# Patient Record
Sex: Female | Born: 1985 | Race: White | Hispanic: No | Marital: Married | Smoking: Never smoker
Health system: Southern US, Community
[De-identification: ages and names within clinical notes are randomized; demographics above are authoritative.]

## PROBLEM LIST (undated history)

## (undated) DIAGNOSIS — F419 Anxiety disorder, unspecified: Secondary | ICD-10-CM

## (undated) DIAGNOSIS — T7840XA Allergy, unspecified, initial encounter: Secondary | ICD-10-CM

## (undated) DIAGNOSIS — F32A Depression, unspecified: Secondary | ICD-10-CM

## (undated) HISTORY — DX: Depression, unspecified: F32.A

## (undated) HISTORY — DX: Allergy, unspecified, initial encounter: T78.40XA

## (undated) HISTORY — DX: Anxiety disorder, unspecified: F41.9

---

## 2006-07-30 HISTORY — PX: GALLBLADDER SURGERY: SHX652

## 2016-01-14 ENCOUNTER — Other Ambulatory Visit: Payer: Self-pay | Admitting: Orthopedic Surgery

## 2016-01-14 DIAGNOSIS — M25532 Pain in left wrist: Secondary | ICD-10-CM

## 2016-02-02 ENCOUNTER — Ambulatory Visit
Admission: RE | Admit: 2016-02-02 | Discharge: 2016-02-02 | Disposition: A | Discharge status: Home / Self Care | Payer: BC Managed Care – PPO | Source: Ambulatory Visit | Attending: Orthopedic Surgery | Admitting: Orthopedic Surgery

## 2016-02-02 ENCOUNTER — Other Ambulatory Visit: Payer: Self-pay

## 2016-02-02 DIAGNOSIS — M25532 Pain in left wrist: Secondary | ICD-10-CM

## 2016-02-02 MED ORDER — IOPAMIDOL (ISOVUE-M 200) INJECTION 41%
5.0000 mL | Freq: Once | INTRAMUSCULAR | Status: AC
  Administered 2016-02-02: 5 mL via INTRA_ARTICULAR

## 2016-07-05 ENCOUNTER — Other Ambulatory Visit: Payer: Self-pay | Admitting: Orthopaedic Surgery

## 2016-07-05 DIAGNOSIS — M4716 Other spondylosis with myelopathy, lumbar region: Secondary | ICD-10-CM

## 2016-07-10 ENCOUNTER — Ambulatory Visit
Admission: RE | Admit: 2016-07-10 | Discharge: 2016-07-10 | Disposition: A | Discharge status: Home / Self Care | Payer: BC Managed Care – PPO | Source: Ambulatory Visit | Attending: Orthopaedic Surgery | Admitting: Orthopaedic Surgery

## 2016-07-10 DIAGNOSIS — M4716 Other spondylosis with myelopathy, lumbar region: Secondary | ICD-10-CM

## 2019-10-04 ENCOUNTER — Ambulatory Visit: Payer: Self-pay | Attending: Internal Medicine

## 2019-10-04 DIAGNOSIS — Z23 Encounter for immunization: Secondary | ICD-10-CM | POA: Insufficient documentation

## 2019-10-04 NOTE — Progress Notes (Signed)
   Covid-19 Vaccination Clinic  Name:  Debra Reed    MRN: [id number] DOB: [birth year]  10/04/2019  [name] was observed post Covid-19 immunization for 15 minutes without incident. She was provided with Vaccine Information Sheet and instruction to access the V-Safe system.   [name] was instructed to call 911 with any severe reactions post vaccine: Marland Kitchen Difficulty breathing  . Swelling of face and throat  . A fast heartbeat  . A bad rash all over body  . Dizziness and weakness   Immunizations Administered    Name Date Dose VIS Date Route   Pfizer COVID-19 Vaccine 10/04/2019  1:55 PM 0.3 mL 07/10/2019 Intramuscular   Manufacturer: [employer], [employer]   Lot: [id number]   NDC: [address]-4

## 2019-10-25 ENCOUNTER — Ambulatory Visit: Payer: Self-pay | Attending: Internal Medicine

## 2019-10-25 DIAGNOSIS — Z23 Encounter for immunization: Secondary | ICD-10-CM

## 2019-10-25 NOTE — Progress Notes (Signed)
   Covid-19 Vaccination Clinic  Name:  Debra Reed    MRN: [id number] DOB: 1985/12/08  10/25/2019  [name] was observed post Covid-19 immunization for 15 minutes without incident. She was provided with Vaccine Information Sheet and instruction to access the V-Safe system.   [name] was instructed to call 911 with any severe reactions post vaccine: Marland Kitchen Difficulty breathing  . Swelling of face and throat  . A fast heartbeat  . A bad rash all over body  . Dizziness and weakness   Immunizations Administered    Name Date Dose VIS Date Route   Pfizer COVID-19 Vaccine 10/25/2019 12:31 PM 0.3 mL 07/10/2019 Intramuscular   Manufacturer: [employer], [employer]   Lot: [id number]   NDC: [address]-1

## 2019-12-31 ENCOUNTER — Encounter: Payer: Self-pay | Admitting: Nurse Practitioner

## 2020-01-25 ENCOUNTER — Ambulatory Visit: Payer: Self-pay | Admitting: Nurse Practitioner

## 2020-03-24 ENCOUNTER — Other Ambulatory Visit: Payer: Self-pay | Admitting: Gastroenterology

## 2020-03-24 DIAGNOSIS — R109 Unspecified abdominal pain: Secondary | ICD-10-CM

## 2020-03-24 DIAGNOSIS — R112 Nausea with vomiting, unspecified: Secondary | ICD-10-CM

## 2020-03-25 ENCOUNTER — Ambulatory Visit
Admission: RE | Admit: 2020-03-25 | Discharge: 2020-03-25 | Disposition: A | Discharge status: Home / Self Care | Payer: BC Managed Care – PPO | Source: Ambulatory Visit | Attending: Gastroenterology | Admitting: Gastroenterology

## 2020-03-25 DIAGNOSIS — R109 Unspecified abdominal pain: Secondary | ICD-10-CM

## 2020-03-25 DIAGNOSIS — R112 Nausea with vomiting, unspecified: Secondary | ICD-10-CM

## 2020-03-25 MED ORDER — IOPAMIDOL (ISOVUE-300) INJECTION 61%
100.0000 mL | Freq: Once | INTRAVENOUS | Status: AC | PRN
  Administered 2020-03-25: 100 mL via INTRAVENOUS

## 2020-08-31 ENCOUNTER — Telehealth: Payer: Self-pay

## 2020-08-31 NOTE — Telephone Encounter (Signed)
Pt was a pt at [facility] . She has set up a new pt the end of March.   Pt is requesting a refill on her dicyclomine 20 mg, which Alyssa previously prescribed.   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 I told pt I would relay message to Alyssa and try to get that message to her in her MyChart account.

## 2020-09-05 NOTE — Telephone Encounter (Signed)
See below

## 2020-09-05 NOTE — Telephone Encounter (Signed)
Called pt. Relayed Debra Reed is okay prescribing non controlled medications, so we can help with the Dicyclomine, she just needs to get her pharmacy to send a request. Resent pt an activation code for mychart. Instructed pt that once she gets mychart set up, to message Debra Reed what exactly she wants her note to say and that we would get that to her. Pt verbalized understanding

## 2020-09-05 NOTE — Telephone Encounter (Signed)
I am very familiar with patient and comfortable taking care of her non-controlled medication refills at this time if she can have the pharmacy send the request to [place]. Also, it looks like her MyChart account needs to be completed, and then I can complete that note for her. I just need to know details of what the note should say. Thanks!

## 2020-09-12 ENCOUNTER — Telehealth: Payer: Self-pay

## 2020-09-12 ENCOUNTER — Other Ambulatory Visit: Payer: Self-pay | Admitting: Physician Assistant

## 2020-09-12 NOTE — Telephone Encounter (Signed)
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 Please advise.

## 2020-09-12 NOTE — Telephone Encounter (Signed)
Letter sent to patient through her MyChart. Thanks!

## 2020-10-21 ENCOUNTER — Encounter: Payer: Self-pay | Admitting: Physician Assistant

## 2020-10-21 ENCOUNTER — Ambulatory Visit (INDEPENDENT_AMBULATORY_CARE_PROVIDER_SITE_OTHER): Payer: BC Managed Care – PPO | Admitting: Physician Assistant

## 2020-10-21 ENCOUNTER — Other Ambulatory Visit: Payer: Self-pay

## 2020-10-21 VITALS — BP 107/72 | HR 68 | Temp 97.3°F | Ht 68.0 in | Wt 191.0 lb

## 2020-10-21 DIAGNOSIS — Z8669 Personal history of other diseases of the nervous system and sense organs: Secondary | ICD-10-CM | POA: Diagnosis not present

## 2020-10-21 DIAGNOSIS — K580 Irritable bowel syndrome with diarrhea: Secondary | ICD-10-CM | POA: Diagnosis not present

## 2020-10-21 DIAGNOSIS — F419 Anxiety disorder, unspecified: Secondary | ICD-10-CM

## 2020-10-21 DIAGNOSIS — F5101 Primary insomnia: Secondary | ICD-10-CM

## 2020-10-21 DIAGNOSIS — F32A Depression, unspecified: Secondary | ICD-10-CM

## 2020-10-21 NOTE — Progress Notes (Signed)
New Patient Office Visit  Subjective:  Patient ID: Debra Reed, female    DOB: 31-Jul-1985  Age: 35 y.o. MRN: [id number]   HPI Debra Reed presents for new patient establishment. Patient was previously seen by myself at [facility].  She has started to see Lennox Laity, a counselor through [employer], for anxiety and depression, as well as Kizzie Fantasia, NP. She has started to have less "stomach issues" with her IBS-D since seeing them. Recently tried Effexor, but states this gave her the worst migraine she has ever had. She also has hx of insomnia and was doing well with Ambien, but this stopped working within the last few months. They tried her on Trazodone, which she says works intermittently.   Hx of migraines. Stable. Occasionally needs acute treatment, does not have medications with her today.   Past Medical History:  Diagnosis Date  . Allergy   . Anxiety   . Depression     Past Surgical History:  Procedure Laterality Date  . GALLBLADDER SURGERY  2008    Family History  Problem Relation Age of Onset  . Depression Mother   . Hyperlipidemia Mother   . Hypertension Mother     Social History   Socioeconomic History  . Marital status: Married    Spouse name: Not on file  . Number of children: Not on file  . Years of education: Not on file  . Highest education level: Not on file  Occupational History  . Not on file  Tobacco Use  . Smoking status: Never Smoker  . Smokeless tobacco: Never Used  Vaping Use  . Vaping Use: Never used  Substance and Sexual Activity  . Alcohol use: Yes    Alcohol/week: 1.0 standard drink    Types: 1 Glasses of wine per week  . Drug use: Not on file  . Sexual activity: Not on file  Other Topics Concern  . Not on file  Social History Narrative  . Not on file   Social Determinants of Health   Financial Resource Strain: Not on file  Food Insecurity: Not on file  Transportation Needs: Not on file  Physical  Activity: Not on file  Stress: Not on file  Social Connections: Not on file  Intimate Partner Violence: Not on file    ROS Review of Systems  Constitutional: Negative for activity change, appetite change, fever and unexpected weight change.  HENT: Negative for congestion.   Eyes: Negative for visual disturbance.  Respiratory: Negative for apnea, cough and shortness of breath.   Cardiovascular: Negative for chest pain, palpitations and leg swelling.  Gastrointestinal: Positive for diarrhea (intermittent). Negative for abdominal pain, blood in stool and constipation.  Endocrine: Negative for polydipsia, polyphagia and polyuria.  Genitourinary: Negative for dysuria and pelvic pain.  Musculoskeletal: Negative for arthralgias.  Skin: Negative for rash.  Neurological: Negative for dizziness, weakness and headaches.  Hematological: Negative for adenopathy. Does not bruise/bleed easily.  Psychiatric/Behavioral: Positive for sleep disturbance. Negative for suicidal ideas. The patient is nervous/anxious.     Objective:   Today's Vitals: BP 107/72   Pulse 68   Temp (!) 97.3 F (36.3 C)   Ht 5\' 8"  (1.727 m)   Wt 191 lb (86.6 kg)   LMP  (LMP Unknown) Comment: IUD  SpO2 99%   BMI 29.04 kg/m   Physical Exam Vitals and nursing note reviewed.  Constitutional:      Appearance: Normal appearance. She is normal weight. She is not toxic-appearing.  HENT:  Head: Normocephalic and atraumatic.     Right Ear: Tympanic membrane, ear canal and external ear normal.     Left Ear: Tympanic membrane, ear canal and external ear normal.     Nose: Nose normal.     Mouth/Throat:     Mouth: Mucous membranes are moist.  Eyes:     Extraocular Movements: Extraocular movements intact.     Conjunctiva/sclera: Conjunctivae normal.     Pupils: Pupils are equal, round, and reactive to light.  Cardiovascular:     Rate and Rhythm: Normal rate and regular rhythm.     Pulses: Normal pulses.     Heart  sounds: Normal heart sounds.  Pulmonary:     Effort: Pulmonary effort is normal.     Breath sounds: Normal breath sounds.  Abdominal:     General: Abdomen is flat. Bowel sounds are normal.     Palpations: Abdomen is soft.  Musculoskeletal:        General: Normal range of motion.     Cervical back: Normal range of motion and neck supple.  Skin:    General: Skin is warm and dry.  Neurological:     General: No focal deficit present.     Mental Status: She is alert and oriented to person, place, and time.  Psychiatric:        Mood and Affect: Mood normal.        Behavior: Behavior normal.        Thought Content: Thought content normal.        Judgment: Judgment normal.     Assessment & Plan:   Problem List Items Addressed This Visit   None   Visit Diagnoses    Anxiety and depression    -  Primary   Irritable bowel syndrome with diarrhea       Relevant Medications   dicyclomine (BENTYL) 20 MG tablet   Primary insomnia       History of migraine          Outpatient Encounter Medications as of 10/21/2020  Medication Sig  . dicyclomine (BENTYL) 20 MG tablet   . levonorgestrel (MIRENA, 52 MG,) 20 MCG/24HR IUD TO BE INSERTED ONE TIME BY PRESCRIBER. ROUTE INTRAUTERINE.   No facility-administered encounter medications on file as of 10/21/2020.    Follow-up: Return in about 1 year (around 10/21/2021) for annual CPE and labs .   New patient establishment in office today. Currently stable with medications. She had no acute concerns today, but will call for refills prn. She will continue f/up with [facility].  Total time spent with patient reviewing history, medications, and documentation was 33 minutes today.   Carra Brindley M Jaunice Mirza, PA-C

## 2020-11-07 ENCOUNTER — Other Ambulatory Visit: Payer: Self-pay

## 2020-11-07 ENCOUNTER — Telehealth: Payer: Self-pay

## 2020-11-07 MED ORDER — DICYCLOMINE HCL 20 MG PO TABS: 20.0000 mg | ORAL_TABLET | Freq: Three times a day (TID) | ORAL | 1 refills | Status: DC

## 2020-11-07 NOTE — Telephone Encounter (Signed)
.   LAST APPOINTMENT DATE: 10/21/2020   NEXT APPOINTMENT DATE:@Visit  date not found  MEDICATION:dicyclomine (BENTYL) 20 MG tablet   PHARMACY:[id number] - [place], [place] - [address]

## 2020-11-07 NOTE — Telephone Encounter (Signed)
Rx sent in

## 2021-02-08 ENCOUNTER — Other Ambulatory Visit: Payer: Self-pay

## 2021-02-08 ENCOUNTER — Telehealth: Payer: Self-pay

## 2021-02-08 MED ORDER — DICYCLOMINE HCL 20 MG PO TABS: 20.0000 mg | ORAL_TABLET | Freq: Three times a day (TID) | ORAL | 1 refills | Status: DC

## 2021-02-08 NOTE — Telephone Encounter (Signed)
  LAST APPOINTMENT DATE: 10/21/2020  NEXT APPOINTMENT DATE:@Visit  date not found  MEDICATION:dicyclomine (BENTYL) 20 MG tablet (Expired)  PHARMACY: [facility] [address] [place] - [address]    Please advise

## 2021-02-08 NOTE — Telephone Encounter (Signed)
Rx sent in

## 2021-02-21 IMAGING — CT CT ABD-PELV W/ CM
2 of 4 series · 13 of 46 positions shown, 15 images · IV contrast (iopamidol)
Comparison: Abdominal ultrasound 03/23/2020 report from CT abdomen
from 12/13/2007

CLINICAL DATA: Abdominal pain with nausea, vomiting, and diarrhea.
Symptoms for 8 months but worsening over the past week

EXAM:
CT ABDOMEN AND PELVIS WITH CONTRAST
TECHNIQUE: Multidetector CT imaging of the abdomen and pelvis was performed
using the standard protocol following bolus administration of
intravenous contrast.
CONTRAST:  100mL LGZCHM-I11 IOPAMIDOL (LGZCHM-I11) INJECTION 61%

[Series 3: abd pelvis 5.00 br40 s3 axial · axial · 0.69mm/px · z∈[+1401,+1781]mm · 10 of 92 slices shown, 12 images]
[im 8/92  soft-tissue]
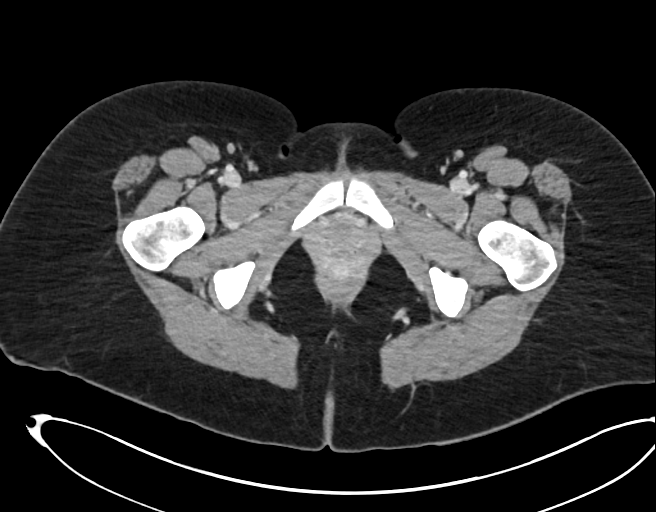
[im 8/92  bone]
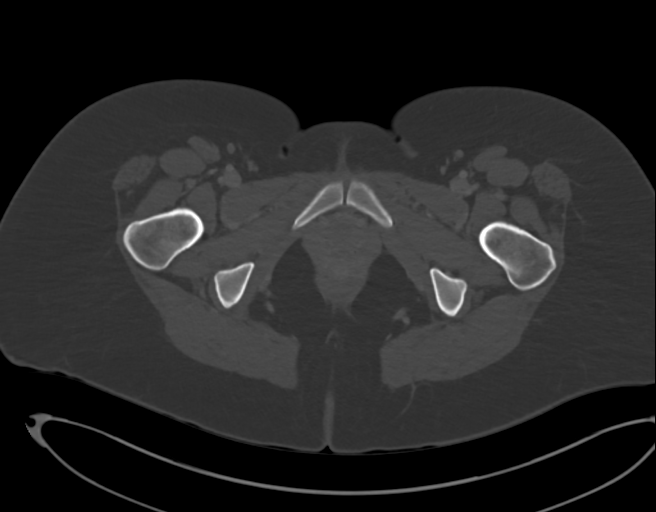
[im 16/92  soft-tissue]
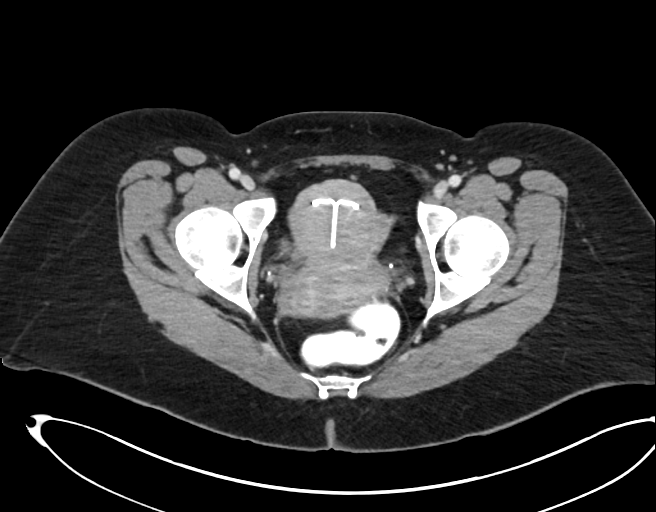
[im 24/92  soft-tissue]
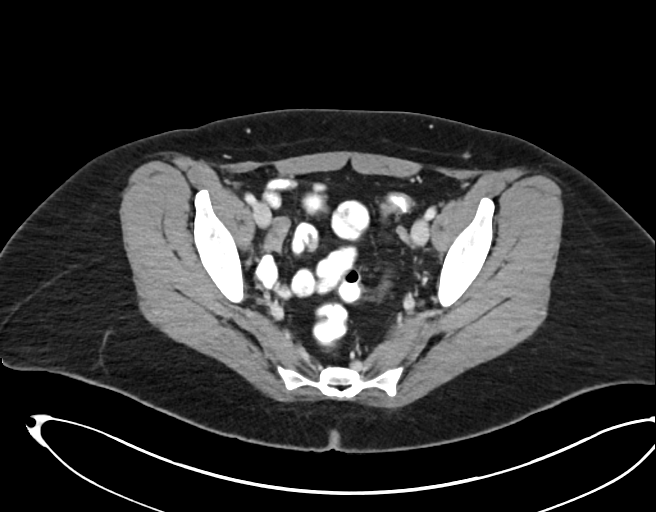
[im 32/92  soft-tissue]
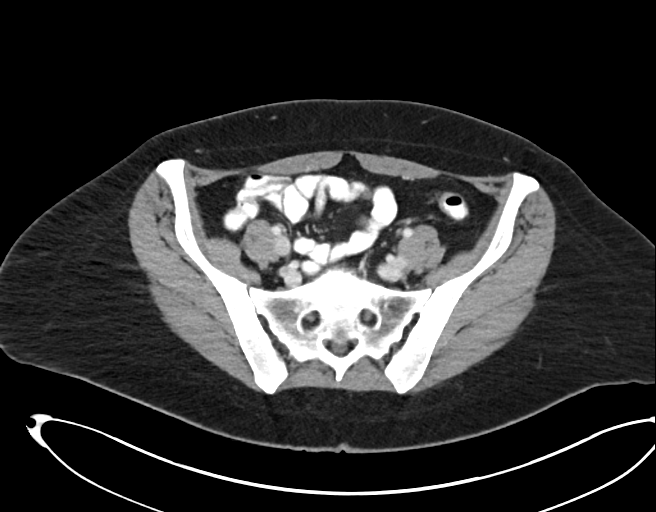
[im 40/92  soft-tissue]
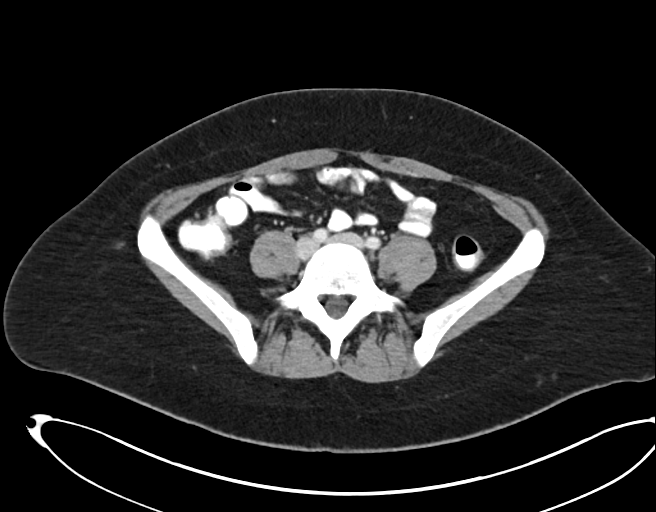
[im 52/92  soft-tissue]
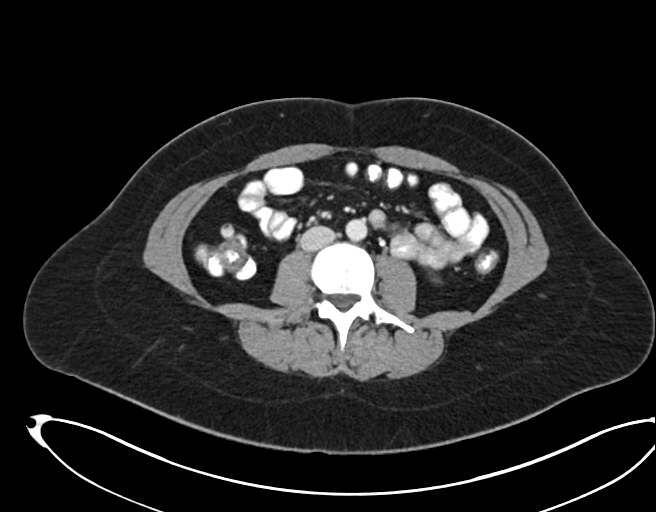
[im 60/92  soft-tissue]
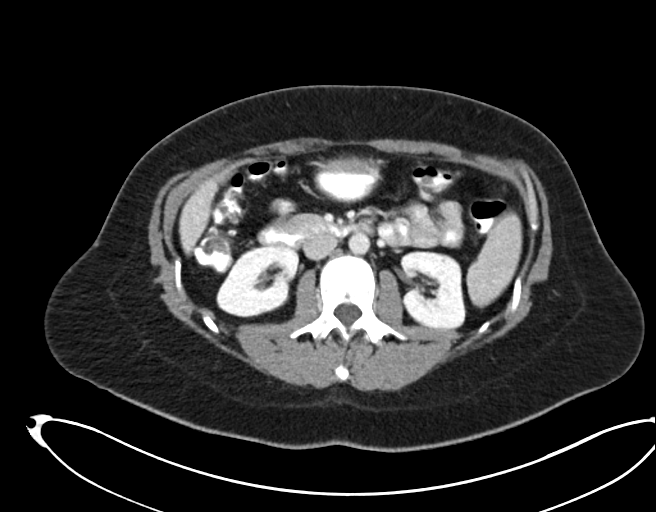
[im 68/92  soft-tissue]
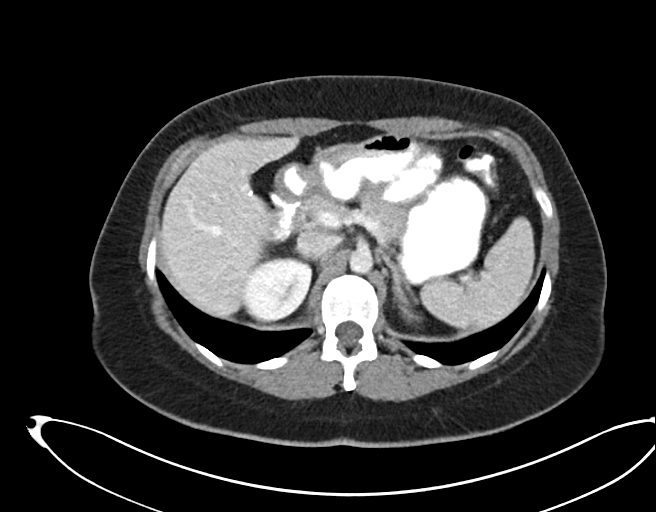
[im 76/92  soft-tissue]
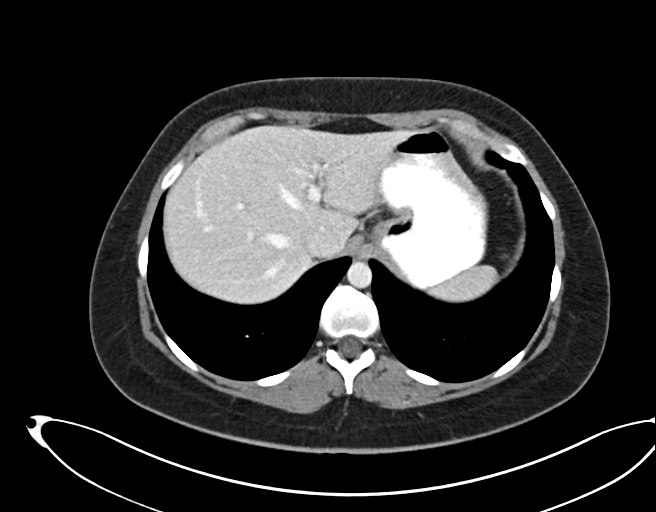
[im 76/92  bone]
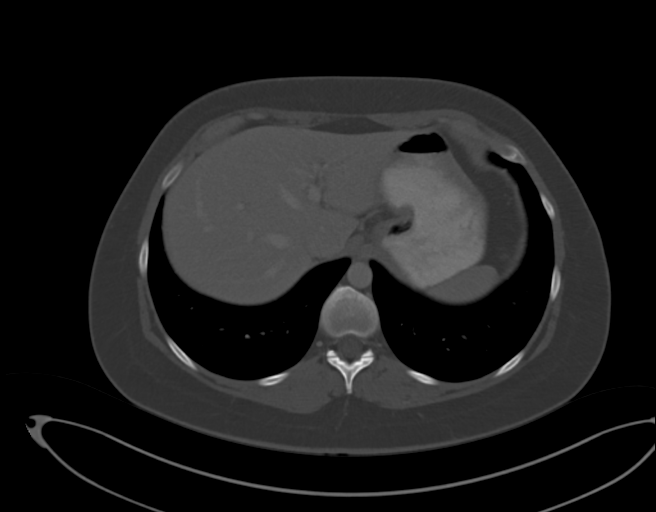
[im 84/92  soft-tissue]
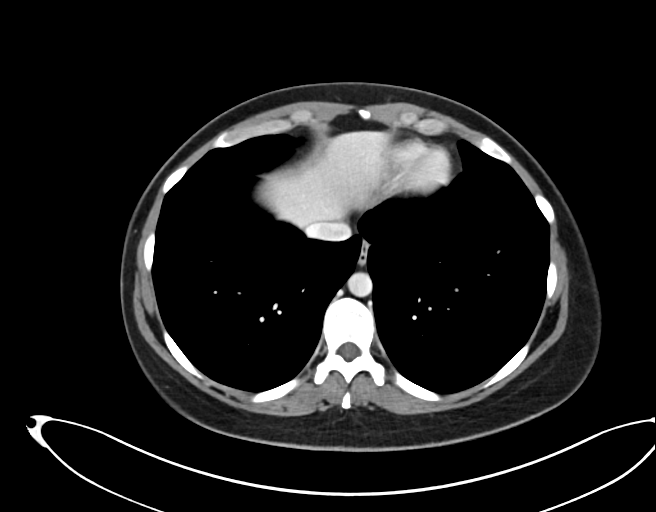

[Series 7: abd pelvis 2.00 br40 s3 cor · coronal · 0.88mm/px · 3 of 176 slices shown]
[im 59/176  soft-tissue]
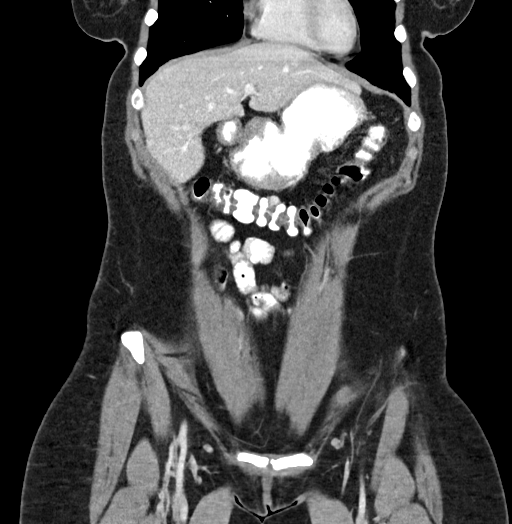
[im 78/176  soft-tissue]
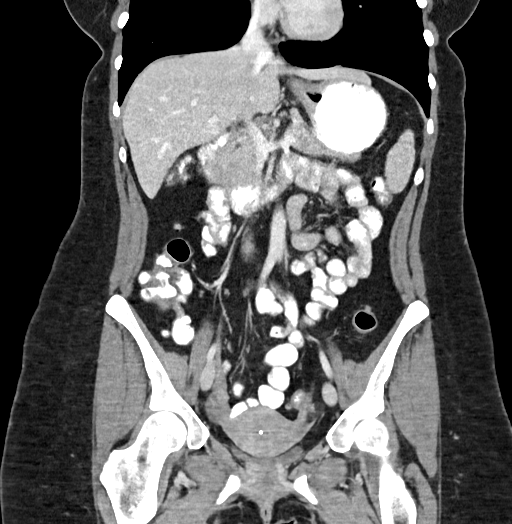
[im 98/176  soft-tissue]
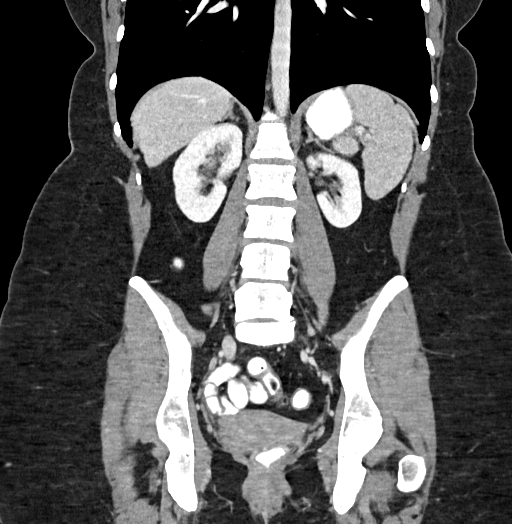

[13 of 46 positions shown; findings below may reference images not displayed]

FINDINGS: Lower chest: Unremarkable

Hepatobiliary: Hypodensity along the falciform ligament compatible
with mild focal fatty infiltration. 0.4 by 0.3 by 0.3 cm hypodensity
in segment 4 of the liver on image [DATE] may represent a tiny cyst or
small dilated segment of the biliary tree but is technically
nonspecific and likely benign/incidental.

Cholecystectomy noted. No significant degree of extrahepatic biliary
dilatation.

Pancreas: Unremarkable

Spleen: Unremarkable

Adrenals/Urinary Tract: Unremarkable

Stomach/Bowel: Scattered sigmoid colon diverticula without findings
of diverticulitis. Orally administered contrast extends through to
the rectum. No compelling findings of bowel wall thickening. Normal
appearance of the appendix and terminal ileum.

Vascular/Lymphatic: Unremarkable

Reproductive: A T-shaped IUD is satisfactorily positioned along the
endometrium. The ovaries appear normal.

Other: No supplemental non-categorized findings.

Musculoskeletal: Reduced intervertebral disc height at L5-S1
suggesting degenerative disc disease. No overt impingement.
IMPRESSION: 1. A cause for the patient's abdominal pain is not identified.
2. Scattered sigmoid colon diverticula without findings of
diverticulitis.
3. T-shaped IUD is satisfactorily positioned along the endometrium.
4. Reduced intervertebral disc height at L5-S1 suggesting
degenerative disc disease.

## 2021-05-16 ENCOUNTER — Telehealth: Payer: Self-pay

## 2021-05-16 NOTE — Telephone Encounter (Signed)
  Encourage patient to contact the pharmacy for refills or they can request refills through [facility]  LAST APPOINTMENT DATE:  10/21/2020  NEXT APPOINTMENT DATE: 09/2021  MEDICATION: dicyclomine (BENTYL) 20 MG tablet (Expired)  Is the patient out of medication? no  PHARMACY: [facility] [address] [place] - [address]  Let patient know to contact pharmacy at the end of the day to make sure medication is ready.  Please notify patient to allow 48-72 hours to process

## 2021-05-17 ENCOUNTER — Other Ambulatory Visit: Payer: Self-pay

## 2021-05-17 MED ORDER — DICYCLOMINE HCL 20 MG PO TABS: 20.0000 mg | ORAL_TABLET | Freq: Three times a day (TID) | ORAL | 1 refills | Status: DC

## 2021-05-17 NOTE — Telephone Encounter (Signed)
Rx sent in

## 2021-06-06 ENCOUNTER — Telehealth (INDEPENDENT_AMBULATORY_CARE_PROVIDER_SITE_OTHER): Payer: BC Managed Care – PPO | Admitting: Physician Assistant

## 2021-06-06 DIAGNOSIS — R051 Acute cough: Secondary | ICD-10-CM

## 2021-06-06 DIAGNOSIS — R6889 Other general symptoms and signs: Secondary | ICD-10-CM

## 2021-06-06 MED ORDER — BENZONATATE 100 MG PO CAPS: 100.0000 mg | ORAL_CAPSULE | Freq: Three times a day (TID) | ORAL | 0 refills | Status: DC | PRN

## 2021-06-06 MED ORDER — OSELTAMIVIR PHOSPHATE 75 MG PO CAPS: 75.0000 mg | ORAL_CAPSULE | Freq: Two times a day (BID) | ORAL | 0 refills | Status: DC

## 2021-06-06 NOTE — Progress Notes (Addendum)
Virtual Visit via Video Note  I connected with  Debra Reed  on 06/06/21 at 11:30 AM EST by a video enabled telemedicine application and verified that I am speaking with the correct person using two identifiers.  Location: Patient: home Provider: Nature conservation officer at [employer] Persons present: Patient and myself   I discussed the limitations of evaluation and management by telemedicine and the availability of in person appointments. The patient expressed understanding and agreed to proceed.   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   Assessment and Plan:  1. Flu-like symptoms 2. Acute cough -Influenza A is very predominant in the community right now, will treat presumptively for influenza.  She will take Tamiflu 75 mg 1 tablet twice per day for the next 5 days.  She knows to rest, push fluids, Tylenol and ibuprofen as needed.  Low threshold for emergency department should she acutely worsen. -Tessalon Perles for added relief to help suppress the cough. -She is also going to take a home COVID-19 test and let me know the results of this.  If she were to test positive for COVID-19, consider antiviral treatment for this as well. -Out of work note provided for this week.   Addendum: Pt called back to inform me her home COVID-19 test  was negative  Follow Up Instructions:    I discussed the assessment and treatment plan with the patient. The patient was provided an opportunity to ask questions and all were answered. The patient agreed with the plan and demonstrated an understanding of the instructions.   The patient was advised to call back or seek an in-person evaluation if the symptoms worsen or if the condition fails to improve as anticipated.  Camber Ninh M Jamerius Boeckman, PA-C

## 2021-08-23 ENCOUNTER — Other Ambulatory Visit: Payer: Self-pay | Admitting: Physician Assistant

## 2021-09-27 ENCOUNTER — Other Ambulatory Visit: Payer: Self-pay

## 2021-09-27 ENCOUNTER — Ambulatory Visit: Payer: BC Managed Care – PPO | Admitting: Physician Assistant

## 2021-09-27 ENCOUNTER — Encounter: Payer: Self-pay | Admitting: Physician Assistant

## 2021-09-27 VITALS — BP 98/58 | HR 85 | Temp 98.2°F | Wt 235.0 lb

## 2021-09-27 DIAGNOSIS — R319 Hematuria, unspecified: Secondary | ICD-10-CM

## 2021-09-27 DIAGNOSIS — R82998 Other abnormal findings in urine: Secondary | ICD-10-CM

## 2021-09-27 DIAGNOSIS — R300 Dysuria: Secondary | ICD-10-CM | POA: Diagnosis not present

## 2021-09-27 LAB — POCT URINALYSIS DIPSTICK
Bilirubin, UA: NEGATIVE
Blood, UA: POSITIVE
Glucose, UA: NEGATIVE
Ketones, UA: NEGATIVE
Nitrite, UA: NEGATIVE
Protein, UA: NEGATIVE
Spec Grav, UA: 1.01 (ref 1.010–1.025)
Urobilinogen, UA: 0.2 E.U./dL
pH, UA: 6.5 (ref 5.0–8.0)

## 2021-09-27 MED ORDER — NITROFURANTOIN MONOHYD MACRO 100 MG PO CAPS: 100.0000 mg | ORAL_CAPSULE | Freq: Two times a day (BID) | ORAL | 0 refills | Status: AC

## 2021-09-27 NOTE — Progress Notes (Signed)
? ?Subjective:  ? ? Patient ID: Debra Reed, female    DOB: November 26, 1985, 36 y.o.   MRN: [id number] ? ?Chief Complaint  ?Patient presents with  ? Urinary Tract Infection  ?  Burning and frequency started yesterday  ? ? ?HPI ?Patient is in today for urinary pressure, dysuria x 1 day. Thinks she has a UTI. It has been several years since last infection, but says this feels the same. She has been pushing water. No fever or chills. Some abd pressure. No flank pain. Some hematuria. No discharge. No other complaints.  ? ?Past Medical History:  ?Diagnosis Date  ? Allergy   ? Anxiety   ? Depression   ? ? ?Past Surgical History:  ?Procedure Laterality Date  ? GALLBLADDER SURGERY  2008  ? ? ?Family History  ?Problem Relation Age of Onset  ? Depression Mother   ? Hyperlipidemia Mother   ? Hypertension Mother   ? ? ?Social History  ? ?Tobacco Use  ? Smoking status: Never  ? Smokeless tobacco: Never  ?Vaping Use  ? Vaping Use: Never used  ?Substance Use Topics  ? Alcohol use: Yes  ?  Alcohol/week: 1.0 standard drink  ?  Types: 1 Glasses of wine per week  ?  ? ?No Known Allergies ? ?Review of Systems ?NEGATIVE UNLESS OTHERWISE INDICATED IN HPI ? ? ?   ?Objective:  ?  ? ?BP (!) 98/58   Pulse 85   Temp 98.2 ?F (36.8 ?C) (Temporal)   Wt 235 lb (106.6 kg)   SpO2 100%   BMI 35.73 kg/m?  ? ?Wt Readings from Last 3 Encounters:  ?09/27/21 235 lb (106.6 kg)  ?10/21/20 191 lb (86.6 kg)  ? ? ?BP Readings from Last 3 Encounters:  ?09/27/21 (!) 98/58  ?10/21/20 107/72  ?  ? ?Physical Exam ?Vitals and nursing note reviewed.  ?Constitutional:   ?   Appearance: Normal appearance. She is not toxic-appearing.  ?HENT:  ?   Head: Normocephalic and atraumatic.  ?   Nose: Nose normal.  ?   Mouth/Throat:  ?   Mouth: Mucous membranes are moist.  ?Eyes:  ?   Extraocular Movements: Extraocular movements intact.  ?   Conjunctiva/sclera: Conjunctivae normal.  ?   Pupils: Pupils are equal, round, and reactive to light.  ?Cardiovascular:  ?   Rate and  Rhythm: Normal rate and regular rhythm.  ?   Pulses: Normal pulses.  ?   Heart sounds: Normal heart sounds.  ?Pulmonary:  ?   Effort: Pulmonary effort is normal.  ?   Breath sounds: Normal breath sounds.  ?Abdominal:  ?   General: Abdomen is flat. Bowel sounds are normal.  ?   Palpations: Abdomen is soft.  ?   Tenderness: There is abdominal tenderness (low pressure pain). There is no right CVA tenderness or left CVA tenderness.  ?Musculoskeletal:     ?   General: Normal range of motion.  ?   Cervical back: Normal range of motion and neck supple.  ?Skin: ?   General: Skin is warm and dry.  ?Neurological:  ?   General: No focal deficit present.  ?   Mental Status: She is alert and oriented to person, place, and time.  ?Psychiatric:     ?   Mood and Affect: Mood normal.     ?   Behavior: Behavior normal.     ?   Thought Content: Thought content normal.     ?   Judgment: Judgment normal.  ? ? ?   ?  Assessment & Plan:  ? ?Problem List Items Addressed This Visit   ?None ?Visit Diagnoses   ? ? Dysuria    -  Primary  ? Relevant Orders  ? POCT Urinalysis Dipstick (Completed)  ? Urine Culture  ? Hematuria, unspecified type      ? Relevant Orders  ? Urine Culture  ? Leukocytes in urine      ? Relevant Orders  ? Urine Culture  ? ?  ? ? ? ?Meds ordered this encounter  ?Medications  ? nitrofurantoin, macrocrystal-monohydrate, (MACROBID) 100 MG capsule  ?  Sig: Take 1 capsule (100 mg total) by mouth 2 (two) times daily for 7 days.  ?  Dispense:  14 capsule  ?  Refill:  0  ? ?Plan: ?Dysuria - U/A performed in office today. Will send urine for culture and treat with macrobid at this time. Increase water intake. May take AZO for symptomatic relief. Recheck sooner if fever, severe back pain, vomiting, or other acutely worsening symptoms.  ? ? ?Zeba Luby M Welma Mccombs, PA-C ?

## 2021-09-29 LAB — URINE CULTURE
MICRO NUMBER:: 13073765
SPECIMEN QUALITY:: ADEQUATE

## 2021-10-03 ENCOUNTER — Telehealth: Payer: Self-pay | Admitting: Physician Assistant

## 2021-10-03 NOTE — Telephone Encounter (Signed)
Patient returning call from [facility] for lab results- please call [phone].  ?

## 2021-10-04 NOTE — Telephone Encounter (Signed)
Notified patient of lab results.Patient voices understanding.  

## 2021-10-23 ENCOUNTER — Encounter: Payer: BC Managed Care – PPO | Admitting: Physician Assistant

## 2021-11-06 ENCOUNTER — Encounter: Payer: BC Managed Care – PPO | Admitting: Physician Assistant

## 2022-01-03 ENCOUNTER — Telehealth: Payer: BC Managed Care – PPO | Admitting: Physician Assistant

## 2022-01-03 ENCOUNTER — Encounter: Payer: Self-pay | Admitting: Physician Assistant

## 2022-01-03 VITALS — Ht 68.0 in | Wt 235.0 lb

## 2022-01-03 DIAGNOSIS — R051 Acute cough: Secondary | ICD-10-CM | POA: Diagnosis not present

## 2022-01-03 DIAGNOSIS — J0100 Acute maxillary sinusitis, unspecified: Secondary | ICD-10-CM

## 2022-01-03 MED ORDER — AMOXICILLIN-POT CLAVULANATE 875-125 MG PO TABS: 1.0000 | ORAL_TABLET | Freq: Two times a day (BID) | ORAL | 0 refills | Status: AC

## 2022-01-03 MED ORDER — BENZONATATE 100 MG PO CAPS: 100.0000 mg | ORAL_CAPSULE | Freq: Three times a day (TID) | ORAL | 0 refills | Status: DC | PRN

## 2022-01-03 MED ORDER — PREDNISONE 20 MG PO TABS: 20.0000 mg | ORAL_TABLET | Freq: Two times a day (BID) | ORAL | 0 refills | Status: AC

## 2022-01-03 NOTE — Progress Notes (Signed)
   Virtual Visit via Video Note  I connected with  Debra Reed  on 01/03/22 at 11:30 AM EDT by a video enabled telemedicine application and verified that I am speaking with the correct person using two identifiers.  Location: Patient: Work (school) Provider: Therapist, music at [place] present: Patient and myself   I discussed the limitations of evaluation and management by telemedicine and the availability of in person appointments. The patient expressed understanding and agreed to proceed.   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   Assessment and Plan:  Acute maxillary sinusitis, recurrence not specified  Acute cough   Persistent symptoms despite conservative efforts at home. Will Rx Augmentin at this time, take with food. Cautioned on antibiotic use and possible side effects.  Tessalon Perles for added relief of cough.  Advised nasal saline, humidifier, and pushing fluids. Call if worse or no improvement.    Follow Up Instructions:    I discussed the assessment and treatment plan with the patient. The patient was provided an opportunity to ask questions and all were answered. The patient agreed with the plan and demonstrated an understanding of the instructions.   The patient was advised to call back or seek an in-person evaluation if the symptoms worsen or if the condition  fails to improve as anticipated.  Jet Traynham M Darwin Guastella, PA-C

## 2022-01-29 ENCOUNTER — Ambulatory Visit: Payer: BC Managed Care – PPO | Admitting: Physician Assistant

## 2022-02-23 ENCOUNTER — Telehealth (INDEPENDENT_AMBULATORY_CARE_PROVIDER_SITE_OTHER): Payer: BC Managed Care – PPO | Admitting: Physician Assistant

## 2022-02-23 ENCOUNTER — Encounter: Payer: Self-pay | Admitting: Physician Assistant

## 2022-02-23 VITALS — HR 74 | Temp 98.7°F | Ht 68.0 in | Wt 235.0 lb

## 2022-02-23 DIAGNOSIS — K580 Irritable bowel syndrome with diarrhea: Secondary | ICD-10-CM | POA: Diagnosis not present

## 2022-02-23 DIAGNOSIS — E669 Obesity, unspecified: Secondary | ICD-10-CM | POA: Diagnosis not present

## 2022-02-23 NOTE — Progress Notes (Signed)
   Virtual Visit via Video Note  I connected with  Debra Reed  on 02/23/22 at 11:30 AM EDT by a video enabled telemedicine application and verified that I am speaking with the correct person using two identifiers.  Location: Patient: home Provider: Nature conservation officer at [employer] Persons present: Patient and myself   I discussed the limitations of evaluation and management by telemedicine and the availability of in person appointments. The patient expressed understanding and agreed to proceed.   History of Present Illness:  36 yo female presents for virtual visit to discuss dicyclomine & weight.   Hasn't had any IBS-D symptoms in the last 6-12 months.  Has changed diet and going to therapy. Stress overall has greatly diminished. Taking Bentyl 20 mg once or twice per week for the last few months. No issues have come up. Medications with psychiatrist working very well.   Sister-in-law and mother have tried Ozempic with success; interested in medicine. Has tried phentermine in the past without results. 3 weeks in on Weight Watchers. Walking, hitting 10k steps daily. Has lost 6-7 lbs so far. 245 lb starting weight, goal is 145-150 lbs, expects to take 1 year or more to lose completely.    Observations/Objective:   Gen: Awake, alert, no acute distress Resp: Breathing is even and non-labored Psych: calm/pleasant demeanor Neuro: Alert and Oriented x 3, + facial symmetry, speech is clear.   Assessment and Plan:  1. Irritable bowel syndrome with diarrhea Discontinue bentyl at this time; seems that symptoms were anxiety and stress related, those are well-controlled now. Cont good work with diet changes. Call back if any symptoms resurface.  2. Obesity, Class II, BMI 35-39.9 Discussed options including Saxenda once daily injections and also once weekly GLP injections, which she will not be applicable for at this time as she's not diabetic. Encouraged her to cont with [employer]  program and push through slow / steady weight loss. As new medications become available, may be able to pursue other options then.    Follow Up Instructions:    I discussed the assessment and treatment plan with the patient. The patient was provided an opportunity to ask questions and all were answered. The patient agreed with the plan and demonstrated an understanding of the instructions.   The patient was advised to call back or seek an in-person evaluation if the symptoms worsen or if the condition fails to improve as anticipated.  Quantavious Eggert M Daziah Hesler, PA-C

## 2022-04-23 ENCOUNTER — Encounter: Payer: Self-pay | Admitting: *Deleted

## 2022-05-21 ENCOUNTER — Telehealth: Payer: Self-pay | Admitting: Physician Assistant

## 2022-05-21 NOTE — Telephone Encounter (Signed)
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 Please advise patient

## 2022-05-21 NOTE — Telephone Encounter (Signed)
Patient advised as requested 

## 2022-05-21 NOTE — Telephone Encounter (Signed)
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

## 2022-07-10 ENCOUNTER — Other Ambulatory Visit: Payer: Self-pay | Admitting: Physician Assistant

## 2022-07-10 MED ORDER — AMOXICILLIN 500 MG PO CAPS: 500.0000 mg | ORAL_CAPSULE | Freq: Two times a day (BID) | ORAL | 0 refills | Status: AC

## 2022-07-12 ENCOUNTER — Encounter: Payer: Self-pay | Admitting: *Deleted

## 2022-10-16 ENCOUNTER — Encounter: Payer: Self-pay | Admitting: Family Medicine

## 2022-10-16 ENCOUNTER — Ambulatory Visit: Payer: BC Managed Care – PPO | Admitting: Family Medicine

## 2022-10-16 VITALS — BP 117/74 | HR 63 | Temp 97.3°F | Ht 68.0 in | Wt 233.0 lb

## 2022-10-16 DIAGNOSIS — J0100 Acute maxillary sinusitis, unspecified: Secondary | ICD-10-CM

## 2022-10-16 MED ORDER — AMOXICILLIN-POT CLAVULANATE 875-125 MG PO TABS: 1.0000 | ORAL_TABLET | Freq: Two times a day (BID) | ORAL | 0 refills | Status: DC

## 2022-10-16 MED ORDER — BENZONATATE 100 MG PO CAPS: 100.0000 mg | ORAL_CAPSULE | Freq: Three times a day (TID) | ORAL | 0 refills | Status: DC | PRN

## 2022-10-16 MED ORDER — AZELASTINE HCL 0.1 % NA SOLN: 2.0000 | Freq: Two times a day (BID) | NASAL | 12 refills | Status: DC

## 2022-10-16 NOTE — Progress Notes (Signed)
   Debra Reed is a 37 y.o. female who presents today for an office visit.  Assessment/Plan:  Sinusitis  No red flags.  Given length of symptoms will start Augmentin.  Also start Astelin nasal spray.  Will refill Tessalon which has worked well for her in the past for her cough.  Encouraged hydration.  She can continue over-the-counter meds as needed.  We discussed reasons to return to care.  Follow-up as needed.    Subjective:  HPI:  Patient here with cough and congestion. This has been going on for about 3 weeks. Home covid test was negative. Tried taking OTC medications without much improvement. Son has been sick with similar symptoms. No fevers or chills. Some facial pain and pressure. She has a lot of mucus production. Thick and green in the morning.        Objective:  Physical Exam: BP 117/74   Pulse 63   Temp (!) 97.3 F (36.3 C) (Temporal)   Ht 5\' 8"  (1.727 m)   Wt 233 lb (105.7 kg)   SpO2 97%   BMI 35.43 kg/m   Gen: No acute distress, resting comfortably HEENT: TMs with clear effusion.  OP erythematous.  Nasal mucosa edematous bilaterally with thick discharge. CV: Regular rate and rhythm with no murmurs appreciated Pulm: Normal work of breathing, clear to auscultation bilaterally with no crackles, wheezes, or rhonchi Neuro: Grossly normal, moves all extremities Psych: Normal affect and thought content      Debra Reed M. Jerline Pain, MD 10/16/2022 7:45 AM

## 2022-10-16 NOTE — Patient Instructions (Signed)
It was very nice to see you today!  You have a sinus infection.   Please start the Augmentin and Astelin.  Use the Tessalon as needed.  Make sure that you are getting plenty of fluids.  Let us know if not improving.  Take care, Dr Jerline Pain  PLEASE NOTE:  If you had any lab tests, please let us know if you have not heard back within a few days. You may see your results on mychart before we have a chance to review them but we will give you a call once they are reviewed by [place].   If we ordered any referrals today, please let us know if you have not heard from their office within the next week.   If you had any urgent prescriptions sent in today, please check with the pharmacy within an hour of our visit to make sure the prescription was transmitted appropriately.   Please try these tips to maintain a healthy lifestyle:  Eat at least 3 REAL meals and 1-2 snacks per day.  Aim for no more than 5 hours between eating.  If you eat breakfast, please do so within one hour of getting up.   Each meal should contain half fruits/vegetables, one quarter protein, and one quarter carbs (no bigger than a computer mouse)  Cut down on sweet beverages. This includes juice, soda, and sweet tea.   Drink at least 1 glass of water with each meal and aim for at least 8 glasses per day  Exercise at least 150 minutes every week.

## 2022-11-20 ENCOUNTER — Encounter: Payer: Self-pay | Admitting: Physician Assistant

## 2022-11-20 ENCOUNTER — Telehealth (INDEPENDENT_AMBULATORY_CARE_PROVIDER_SITE_OTHER): Payer: BC Managed Care – PPO | Admitting: Physician Assistant

## 2022-11-20 DIAGNOSIS — J029 Acute pharyngitis, unspecified: Secondary | ICD-10-CM

## 2022-11-20 MED ORDER — AMOXICILLIN 500 MG PO CAPS: 1000.0000 mg | ORAL_CAPSULE | Freq: Two times a day (BID) | ORAL | 0 refills | Status: AC

## 2022-11-20 NOTE — Progress Notes (Signed)
   Virtual Visit via Video Note  I connected with  Debra Reed  on 11/20/22 at 11:45 AM EDT by a video enabled telemedicine application and verified that I am speaking with the correct person using two identifiers.  Location: Patient: home Provider: Nature conservation officer at [employer] Persons present: Patient and myself   I discussed the limitations of evaluation and management by telemedicine and the availability of in person appointments. The patient expressed understanding and agreed to proceed.   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   Assessment and Plan:  Acute pharyngitis, unspecified etiology Will treat for likely strep pharyngitis.  Patient is to start amoxicillin 1000 mg BID x 10 days, use supportive care otherwise. Counseled patient on potential for adverse effects with medications prescribed/recommended today, ER and return-to-clinic precautions discussed, patient verbalized understanding.   Follow Up Instructions:    I discussed the assessment and treatment plan with the patient. The patient was provided an opportunity to ask questions and all were answered. The patient agreed with the plan and demonstrated an understanding of the instructions.   The patient was advised to call back or seek an in-person evaluation if the symptoms worsen or if the condition fails to improve as anticipated.  Alexiya Franqui M Emmanuel Ercole, PA-C

## 2022-11-23 ENCOUNTER — Telehealth (INDEPENDENT_AMBULATORY_CARE_PROVIDER_SITE_OTHER): Payer: BC Managed Care – PPO | Admitting: Physician Assistant

## 2022-11-23 VITALS — Ht 68.0 in | Wt 233.0 lb

## 2022-11-23 DIAGNOSIS — Z713 Dietary counseling and surveillance: Secondary | ICD-10-CM | POA: Diagnosis not present

## 2022-11-23 NOTE — Progress Notes (Signed)
   Virtual Visit via Video Note  I connected with  Debra Reed  on 11/23/22 at  8:30 AM EDT by a video enabled telemedicine application and verified that I am speaking with the correct person using two identifiers.  Location: Patient: home Provider: Nature conservation officer at [employer] Persons present: Patient and myself   I discussed the limitations of evaluation and management by telemedicine and the availability of in person appointments. The patient expressed understanding and agreed to proceed.   History of Present Illness:  37 yo female presents for VV to discuss weight concerns. Current wt: 225 lb per scale at home She is actively working on change. She started exercising 3/week last week.  Weight Watchers x 6 months, feels like food noise is better controlled. Would like to discuss medication options to help with wt loss.   Observations/Objective:   Gen: Awake, alert, no acute distress Resp: Breathing is even and non-labored Psych: calm/pleasant demeanor Neuro: Alert and Oriented x 3, + facial symmetry, speech is clear.   Assessment and Plan:  1. Encounter for weight loss counseling Pt in active state of change. Goal wt per pt is 165 lb in the next few years, wants to do things 'right and slow.' Talked about oral medication options such as Wellbutrin, phentermine, orlistat; none highly recommended due to side effects. She would benefit from GLP1 agonists, but state health plan not currently helping with coverage and she is not able to afford very high monthly cost. Will continue efforts at home. Will check labs this summer and monitor progress.    Follow Up Instructions:    I discussed the assessment and treatment plan with the patient. The patient was provided an opportunity to ask questions and all were answered. The patient agreed with the plan and demonstrated an understanding of the instructions.   The patient was advised to call back or seek an  in-person evaluation if the symptoms worsen or if the condition fails to improve as anticipated.  Cintia Gleed M Tadeo Besecker, PA-C

## 2023-02-22 ENCOUNTER — Ambulatory Visit: Payer: BC Managed Care – PPO | Admitting: Physician Assistant

## 2023-02-22 ENCOUNTER — Encounter: Payer: Self-pay | Admitting: Physician Assistant

## 2023-02-22 VITALS — BP 118/80 | HR 52 | Temp 97.6°F | Ht 68.0 in | Wt 237.4 lb

## 2023-02-22 DIAGNOSIS — F419 Anxiety disorder, unspecified: Secondary | ICD-10-CM

## 2023-02-22 DIAGNOSIS — Z6836 Body mass index (BMI) 36.0-36.9, adult: Secondary | ICD-10-CM | POA: Diagnosis not present

## 2023-02-22 DIAGNOSIS — E782 Mixed hyperlipidemia: Secondary | ICD-10-CM

## 2023-02-22 DIAGNOSIS — F32A Depression, unspecified: Secondary | ICD-10-CM

## 2023-02-22 DIAGNOSIS — E559 Vitamin D deficiency, unspecified: Secondary | ICD-10-CM | POA: Diagnosis not present

## 2023-02-22 DIAGNOSIS — E669 Obesity, unspecified: Secondary | ICD-10-CM | POA: Insufficient documentation

## 2023-02-22 DIAGNOSIS — R5383 Other fatigue: Secondary | ICD-10-CM

## 2023-02-22 LAB — LIPID PANEL
Cholesterol: 127 mg/dL (ref 0–200)
HDL: 50.8 mg/dL (ref >39.00)
LDL Cholesterol: 65 mg/dL (ref 0–99)
NonHDL: 76.44
Total CHOL/HDL Ratio: 3
Triglycerides: 56 mg/dL (ref 0.0–149.0)
VLDL: 11.2 mg/dL (ref 0.0–40.0)

## 2023-02-22 LAB — IBC + FERRITIN
Ferritin: 51.1 ng/mL (ref 10.0–291.0)
Iron: 103 ug/dL (ref 42–145)
Saturation Ratios: 32.1 % (ref 20.0–50.0)
TIBC: 320.6 ug/dL (ref 250.0–450.0)
Transferrin: 229 mg/dL (ref 212.0–360.0)

## 2023-02-22 LAB — CBC WITH DIFFERENTIAL/PLATELET
Basophils Absolute: 0.1 10*3/uL (ref 0.0–0.1)
Basophils Relative: 0.8 % (ref 0.0–3.0)
Eosinophils Absolute: 0.1 10*3/uL (ref 0.0–0.7)
Eosinophils Relative: 0.9 % (ref 0.0–5.0)
HCT: 43.7 % (ref 36.0–46.0)
Hemoglobin: 14.7 g/dL (ref 12.0–15.0)
Lymphocytes Relative: 31 % (ref 12.0–46.0)
Lymphs Abs: 2.2 10*3/uL (ref 0.7–4.0)
MCHC: 33.6 g/dL (ref 30.0–36.0)
MCV: 94.4 fl (ref 78.0–100.0)
Monocytes Absolute: 0.5 10*3/uL (ref 0.1–1.0)
Monocytes Relative: 6.7 % (ref 3.0–12.0)
Neutro Abs: 4.3 10*3/uL (ref 1.4–7.7)
Neutrophils Relative %: 60.6 % (ref 43.0–77.0)
Platelets: 232 10*3/uL (ref 150.0–400.0)
RBC: 4.63 Mil/uL (ref 3.87–5.11)
RDW: 12.8 % (ref 11.5–15.5)
WBC: 7.2 10*3/uL (ref 4.0–10.5)

## 2023-02-22 LAB — COMPREHENSIVE METABOLIC PANEL
ALT: 10 U/L (ref 0–35)
AST: 15 U/L (ref 0–37)
Albumin: 4.3 g/dL (ref 3.5–5.2)
Alkaline Phosphatase: 62 U/L (ref 39–117)
BUN: 14 mg/dL (ref 6–23)
CO2: 25 mEq/L (ref 19–32)
Calcium: 9.3 mg/dL (ref 8.4–10.5)
Chloride: 104 mEq/L (ref 96–112)
Creatinine, Ser: 0.88 mg/dL (ref 0.40–1.20)
GFR: 83.68 mL/min (ref >60.00)
Glucose, Bld: 89 mg/dL (ref 70–99)
Potassium: 4.4 mEq/L (ref 3.5–5.1)
Sodium: 137 mEq/L (ref 135–145)
Total Bilirubin: 0.7 mg/dL (ref 0.2–1.2)
Total Protein: 7.1 g/dL (ref 6.0–8.3)

## 2023-02-22 LAB — T4, FREE: Free T4: 0.83 ng/dL (ref 0.60–1.60)

## 2023-02-22 LAB — HEMOGLOBIN A1C: Hgb A1c MFr Bld: 5 % (ref 4.6–6.5)

## 2023-02-22 LAB — VITAMIN B12: Vitamin B-12: 195 pg/mL — ABNORMAL LOW (ref 211–911)

## 2023-02-22 LAB — VITAMIN D 25 HYDROXY (VIT D DEFICIENCY, FRACTURES): VITD: 30.94 ng/mL (ref 30.00–100.00)

## 2023-02-22 LAB — TSH: TSH: 2.76 u[IU]/mL (ref 0.35–5.50)

## 2023-02-22 NOTE — Assessment & Plan Note (Signed)
Stable on Abilify and Buspar Follows with psych Counseling once weekly

## 2023-02-22 NOTE — Progress Notes (Signed)
Subjective:    Patient ID: Debra Reed, female    DOB: February 20, 1986, 37 y.o.   MRN: [id number]  Chief Complaint  Patient presents with   Lab results    HPI Patient is in today for possible elevated cholesterol - wanting to check overall labs too. Eye doctor told her he saw a ring around her eyes and wanted cholesterol checked.  Staying tired still all the time. Sleeping better though. Only 1 cup coffee daily. Vit D deficiency history.  Averaging about 8-10k steps daily. 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   Mirena - doesn't have cycles. Previously very heavy and painful, but regular. Also had migraines with them. Mirena seemed to fix all these issues.   Past Medical History:  Diagnosis Date   Allergy    Anxiety    Depression     Past Surgical History:  Procedure Laterality Date   GALLBLADDER SURGERY  2008    Family History  Problem Relation Age of Onset   Depression Mother    Hyperlipidemia Mother    Hypertension Mother     Social History   Tobacco Use   Smoking status: Never   Smokeless tobacco: Never  Vaping Use   Vaping status: Never Used  Substance Use Topics   Alcohol use: Yes    Alcohol/week: 1.0 standard drink of alcohol    Types: 1 Glasses of wine per week     No Known Allergies  Review of Systems NEGATIVE UNLESS OTHERWISE INDICATED IN HPI      Objective:     BP 118/80   Pulse (!) 52   Temp 97.6 F (36.4 C)   Ht 5\' 8"  (1.727 m)   Wt 237 lb 6.4 oz (107.7 kg)   SpO2 97%   BMI 36.10 kg/m   Wt Readings from Last 3 Encounters:  02/22/23 237 lb 6.4 oz (107.7 kg)  11/23/22 233 lb (105.7 kg)  10/16/22 233 lb (105.7 kg)    BP Readings from Last 3 Encounters:  02/22/23 118/80  10/16/22 117/74  09/27/21 (!) 98/58     Physical Exam Vitals and nursing note reviewed.  Constitutional:      Appearance: Normal appearance. She is obese. She is not toxic-appearing.  HENT:     Head: Normocephalic and atraumatic.      Right Ear: External ear normal.     Left Ear: External ear normal.     Nose: Nose normal.     Mouth/Throat:     Mouth: Mucous membranes are moist.  Eyes:     Extraocular Movements: Extraocular movements intact.     Conjunctiva/sclera: Conjunctivae normal.     Pupils: Pupils are equal, round, and reactive to light.  Cardiovascular:     Rate and Rhythm: Regular rhythm. Bradycardia present.     Pulses: Normal pulses.     Heart sounds: Normal heart sounds.  Pulmonary:     Effort: Pulmonary effort is normal.     Breath sounds: Normal breath sounds.  Musculoskeletal:        General: Normal range of motion.     Cervical back: Normal range of motion and neck supple.  Skin:    General: Skin is warm and dry.  Neurological:     General: No focal deficit present.     Mental Status: She is alert and oriented to person, place, and time.  Psychiatric:        Mood and Affect: Mood normal.  Behavior: Behavior normal.        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 Will check labs, treat pending results. Recommend increase in water and exercise daily.  Orders: -     VITAMIN D 25 Hydroxy (Vit-D Deficiency, Fractures) -     Vitamin B12 -     TSH -     Comprehensive metabolic panel -     CBC with Differential/Platelet -     IBC + Ferritin -     T4, free -     Hemoglobin A1c  Mixed hyperlipidemia -     Lipid panel  Vitamin D deficiency -     VITAMIN D 25 Hydroxy (Vit-D Deficiency, Fractures)  BMI 36.0-36.9,adult -     Hemoglobin A1c  Anxiety and depression Assessment & Plan: Stable on Abilify and Buspar Follows with psych Counseling once weekly       Return if symptoms worsen or fail to improve.   Ziad Maye M Kinsleigh Ludolph, PA-C

## 2023-02-22 NOTE — Assessment & Plan Note (Signed)
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 Will check labs, treat pending results. Recommend increase in water and exercise daily.

## 2023-03-05 ENCOUNTER — Ambulatory Visit: Payer: BC Managed Care – PPO

## 2023-03-05 DIAGNOSIS — E559 Vitamin D deficiency, unspecified: Secondary | ICD-10-CM

## 2023-03-05 MED ORDER — CYANOCOBALAMIN 1000 MCG/ML IJ SOLN
1000.0000 ug | Freq: Once | INTRAMUSCULAR | Status: AC
Start: 2023-03-05 — End: 2023-03-05
  Administered 2023-03-05: 1000 ug via INTRAMUSCULAR

## 2023-03-05 NOTE — Progress Notes (Signed)
Per orders of Alyssa Allwardt, PA-C, injection of B-12 given by Dorris Fetch in right  deltoid. Patient tolerated injection well.

## 2023-03-07 ENCOUNTER — Other Ambulatory Visit: Payer: Self-pay | Admitting: Physician Assistant

## 2023-03-07 DIAGNOSIS — E538 Deficiency of other specified B group vitamins: Secondary | ICD-10-CM

## 2023-03-07 MED ORDER — CYANOCOBALAMIN 1000 MCG/ML IJ SOLN
INTRAMUSCULAR | 5 refills | Status: AC
Start: 2023-03-07 — End: ?

## 2023-03-11 ENCOUNTER — Other Ambulatory Visit: Payer: Self-pay | Admitting: *Deleted

## 2023-03-11 MED ORDER — "BD LUER-LOK SYRINGE 25G X 1"" 3 ML MISC": 0 refills | Status: AC

## 2023-05-27 ENCOUNTER — Encounter: Payer: Self-pay | Admitting: Physician Assistant

## 2023-05-27 ENCOUNTER — Ambulatory Visit (INDEPENDENT_AMBULATORY_CARE_PROVIDER_SITE_OTHER): Payer: BC Managed Care – PPO | Admitting: Physician Assistant

## 2023-05-27 VITALS — BP 106/76 | HR 75 | Temp 97.7°F | Ht 68.0 in | Wt 235.6 lb

## 2023-05-27 DIAGNOSIS — Z0000 Encounter for general adult medical examination without abnormal findings: Secondary | ICD-10-CM | POA: Insufficient documentation

## 2023-05-27 DIAGNOSIS — R1011 Right upper quadrant pain: Secondary | ICD-10-CM | POA: Insufficient documentation

## 2023-05-27 DIAGNOSIS — K909 Intestinal malabsorption, unspecified: Secondary | ICD-10-CM | POA: Insufficient documentation

## 2023-05-27 DIAGNOSIS — R1033 Periumbilical pain: Secondary | ICD-10-CM | POA: Insufficient documentation

## 2023-05-27 DIAGNOSIS — E538 Deficiency of other specified B group vitamins: Secondary | ICD-10-CM | POA: Diagnosis not present

## 2023-05-27 DIAGNOSIS — K580 Irritable bowel syndrome with diarrhea: Secondary | ICD-10-CM | POA: Insufficient documentation

## 2023-05-27 DIAGNOSIS — Z23 Encounter for immunization: Secondary | ICD-10-CM

## 2023-05-27 DIAGNOSIS — R194 Change in bowel habit: Secondary | ICD-10-CM | POA: Insufficient documentation

## 2023-05-27 LAB — VITAMIN B12: Vitamin B-12: 487 pg/mL (ref 211–911)

## 2023-05-27 NOTE — Progress Notes (Unsigned)
Subjective:    Patient ID: Debra Reed, female    DOB: 12-Nov-1985, 37 y.o.   MRN: [id number]  Chief Complaint  Patient presents with   Annual Exam    Pt in office for annual CPE and labs; pt not fasting for labs;     HPI Discussed the use of AI scribe software for clinical note transcription with the patient, who gave verbal consent to proceed.  History of Present Illness   The patient, a Runner, broadcasting/film/video, presents for annual exam. She reports increased stress due to a larger number of students and a recent tragic event involving students. The patient reports constant fatigue and is unsure if the B12 shots are helping. The patient also mentions a history of a prolapsed uterus but reports no current symptoms related to this condition. The patient is currently on Mirena and reports no issues. The patient has a family history of breast and colon cancer but has not yet started regular mammograms or colonoscopies; none in first degree relatives. The patient reports no new symptoms or health concerns.       Past Medical History:  Diagnosis Date   Allergy    Anxiety    Depression     Past Surgical History:  Procedure Laterality Date   GALLBLADDER SURGERY  2008    Family History  Problem Relation Age of Onset   Depression Mother    Hyperlipidemia Mother    Hypertension Mother    Breast cancer Maternal Grandmother    Colon cancer Maternal Grandmother    Breast cancer Paternal Grandmother    Pancreatic cancer Paternal Grandmother     Social History   Tobacco Use   Smoking status: Never   Smokeless tobacco: Never  Vaping Use   Vaping status: Never Used  Substance Use Topics   Alcohol use: Yes    Alcohol/week: 1.0 standard drink of alcohol    Types: 1 Glasses of wine per week     No Known Allergies  Review of Systems NEGATIVE UNLESS OTHERWISE INDICATED IN HPI      Objective:     BP 106/76 (BP Location: Left Arm, Patient Position: Sitting)   Pulse 75   Temp 97.7 F  (36.5 C) (Temporal)   Ht 5\' 8"  (1.727 m)   Wt 235 lb 9.6 oz (106.9 kg)   SpO2 99%   BMI 35.82 kg/m   Wt Readings from Last 3 Encounters:  05/27/23 235 lb 9.6 oz (106.9 kg)  02/22/23 237 lb 6.4 oz (107.7 kg)  11/23/22 233 lb (105.7 kg)    BP Readings from Last 3 Encounters:  05/27/23 106/76  02/22/23 118/80  10/16/22 117/74     Physical Exam Vitals and nursing note reviewed.  Constitutional:      Appearance: Normal appearance. She is obese. She is not toxic-appearing.  HENT:     Head: Normocephalic and atraumatic.     Right Ear: Tympanic membrane, ear canal and external ear normal.     Left Ear: Tympanic membrane, ear canal and external ear normal.     Nose: Nose normal.     Mouth/Throat:     Mouth: Mucous membranes are moist.  Eyes:     Extraocular Movements: Extraocular movements intact.     Conjunctiva/sclera: Conjunctivae normal.     Pupils: Pupils are equal, round, and reactive to light.  Cardiovascular:     Rate and Rhythm: Normal rate and regular rhythm.     Pulses: Normal pulses.     Heart sounds:  Normal heart sounds.  Pulmonary:     Effort: Pulmonary effort is normal.     Breath sounds: Normal breath sounds.  Abdominal:     General: Abdomen is flat. Bowel sounds are normal.     Palpations: Abdomen is soft. There is no mass.     Tenderness: There is no abdominal tenderness. There is no right CVA tenderness or left CVA tenderness.  Musculoskeletal:        General: Normal range of motion.     Cervical back: Normal range of motion and neck supple.  Skin:    General: Skin is warm and dry.  Neurological:     General: No focal deficit present.     Mental Status: She is alert and oriented to person, place, and time.  Psychiatric:        Mood and Affect: Mood normal.        Behavior: Behavior normal.        Thought Content: Thought content normal.        Judgment: Judgment normal.        Assessment & Plan:  Encounter for annual physical exam -      Vitamin B12  B12 deficiency  Immunization due -     Flu vaccine trivalent PF, 6mos and older(Flulaval,Afluria,Fluarix,Fluzone)   Age-appropriate screening and counseling performed today. Will check labs and call with results. Preventive measures discussed and printed in AVS for patient.   Patient Counseling: [x]   Nutrition: Stressed importance of moderation in sodium/caffeine intake, saturated fat and cholesterol, caloric balance, sufficient intake of fresh fruits, vegetables, and fiber.  [x]   Stressed the importance of regular exercise.   []   Substance Abuse: Discussed cessation/primary prevention of tobacco, alcohol, or other drug use; driving or other dangerous activities under the influence; availability of treatment for abuse.   []   Injury prevention: Discussed safety belts, safety helmets, smoke detector, smoking near bedding or upholstery.   []   Sexuality: Discussed sexually transmitted diseases, partner selection, use of condoms, avoidance of unintended pregnancy  and contraceptive alternatives.   [x]   Dental health: Discussed importance of regular tooth brushing, flossing, and dental visits.  [x]   Health maintenance and immunizations reviewed. Please refer to Health maintenance section.     GYN appt scheduled this year   Return in about 1 year (around 05/26/2024) for physical.    Devin Foskey M Tremon Sainvil, PA-C

## 2023-05-30 DIAGNOSIS — Z23 Encounter for immunization: Secondary | ICD-10-CM

## 2024-04-08 ENCOUNTER — Encounter: Payer: Self-pay | Admitting: Physician Assistant

## 2024-07-31 ENCOUNTER — Encounter: Payer: Self-pay | Admitting: Physician Assistant

## 2024-07-31 NOTE — Telephone Encounter (Signed)
 Please review and advise. Tks

## 2024-08-02 ENCOUNTER — Other Ambulatory Visit: Payer: Self-pay | Admitting: Physician Assistant

## 2024-08-02 MED ORDER — TIRZEPATIDE-WEIGHT MANAGEMENT 2.5 MG/0.5ML ~~LOC~~ SOLN: 2.5000 mg | SUBCUTANEOUS | 1 refills | Status: DC

## 2024-08-20 ENCOUNTER — Encounter: Payer: Self-pay | Admitting: Physician Assistant

## 2024-08-21 ENCOUNTER — Other Ambulatory Visit: Payer: Self-pay | Admitting: Physician Assistant

## 2024-08-21 MED ORDER — TIRZEPATIDE-WEIGHT MANAGEMENT 5 MG/0.5ML ~~LOC~~ SOLN: 5.0000 mg | SUBCUTANEOUS | 2 refills | Status: AC

## 2024-08-21 NOTE — Telephone Encounter (Signed)
 Please see pt msg and request and advise if agreeable to increase to 5mg  dose before follow up appt on 09/25/24

## 2024-09-25 ENCOUNTER — Encounter: Payer: Self-pay | Admitting: Physician Assistant
